# Patient Record
Sex: Female | Born: 1965 | Race: White | Hispanic: No | Marital: Married | Smoking: Never smoker
Health system: Southern US, Community
[De-identification: ages and names within clinical notes are randomized; demographics above are authoritative.]

## PROBLEM LIST (undated history)

## (undated) DIAGNOSIS — I10 Essential (primary) hypertension: Secondary | ICD-10-CM

## (undated) DIAGNOSIS — F0781 Postconcussional syndrome: Secondary | ICD-10-CM

## (undated) HISTORY — DX: Essential (primary) hypertension: I10

## (undated) HISTORY — PX: ADENOIDECTOMY: SUR15

## (undated) HISTORY — DX: Postconcussional syndrome: F07.81

## (undated) HISTORY — PX: TONSILLECTOMY: SUR1361

---

## 2003-12-22 ENCOUNTER — Ambulatory Visit (HOSPITAL_COMMUNITY): Admission: RE | Admit: 2003-12-22 | Discharge: 2003-12-22 | Payer: Self-pay | Admitting: Obstetrics and Gynecology

## 2006-01-31 ENCOUNTER — Ambulatory Visit (HOSPITAL_COMMUNITY): Admission: RE | Admit: 2006-01-31 | Discharge: 2006-01-31 | Payer: Self-pay | Admitting: Obstetrics and Gynecology

## 2006-02-17 IMAGING — RF DG HYSTEROGRAM
3 series · 3 of 3 positions shown · non-contrast
Comparison: none

CLINICAL DATA: Infertility.
HYSTEROGRAM:
Following placement of a 5 French hysterosalpingography catheter into the lower uterine segment by Dr. Novoa, hysterosalpingography was performed.
The end catheter balloon was lowered at the conclusion of the exam to allow evaluation of the lower uterine segment.
A normal endometrial morphology is seen.
Both fallopian tubes have a normal morphology and bilateral free intraperitoneal spill was documented.  No evidence for loculation of contrast was seen in the pelvis on the post-drainage film to suggest the presence of adhesions.  
IMPRESSION
Normal HSG.

[Series 1: run · 1 of 1 slices shown (1 of 3)]
[im 1/1]
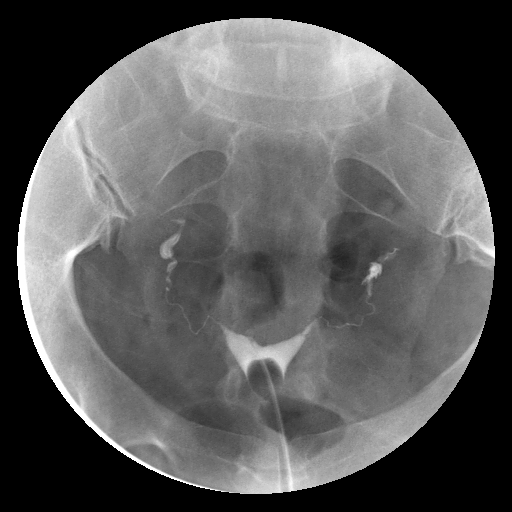

[Series 2: run · 1 of 1 slices shown (2 of 3)]
[im 1/1]
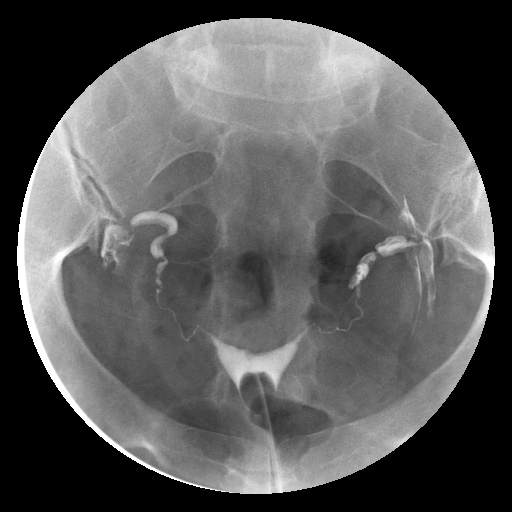

[Series 3: run · 1 of 1 slices shown (3 of 3)]
[im 1/1]
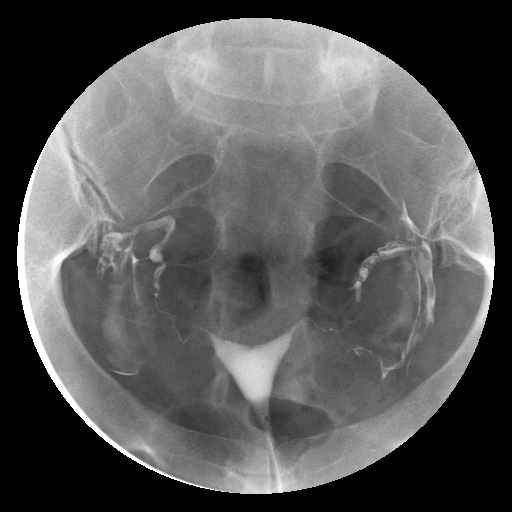

[3 of 3 positions shown; findings below may reference images not displayed]

## 2006-03-30 ENCOUNTER — Inpatient Hospital Stay (HOSPITAL_COMMUNITY): Admission: AD | Admit: 2006-03-30 | Discharge: 2006-03-30 | Payer: Self-pay | Admitting: Obstetrics and Gynecology

## 2006-07-25 ENCOUNTER — Inpatient Hospital Stay: Admission: AD | Admit: 2006-07-25 | Discharge: 2006-07-25 | Payer: Self-pay | Admitting: *Deleted

## 2006-07-26 ENCOUNTER — Inpatient Hospital Stay (HOSPITAL_COMMUNITY): Admission: RE | Admit: 2006-07-26 | Discharge: 2006-07-29 | Payer: Self-pay | Admitting: Obstetrics and Gynecology

## 2006-07-30 ENCOUNTER — Encounter: Admission: RE | Admit: 2006-07-30 | Discharge: 2006-08-28 | Payer: Self-pay | Admitting: Obstetrics and Gynecology

## 2006-08-29 ENCOUNTER — Encounter: Admission: RE | Admit: 2006-08-29 | Discharge: 2006-09-28 | Payer: Self-pay | Admitting: Obstetrics and Gynecology

## 2006-09-29 ENCOUNTER — Encounter: Admission: RE | Admit: 2006-09-29 | Discharge: 2006-10-28 | Payer: Self-pay | Admitting: Obstetrics and Gynecology

## 2006-10-29 ENCOUNTER — Encounter: Admission: RE | Admit: 2006-10-29 | Discharge: 2006-11-28 | Payer: Self-pay | Admitting: Obstetrics and Gynecology

## 2006-11-29 ENCOUNTER — Encounter: Admission: RE | Admit: 2006-11-29 | Discharge: 2006-12-29 | Payer: Self-pay | Admitting: Obstetrics and Gynecology

## 2006-12-30 ENCOUNTER — Encounter: Admission: RE | Admit: 2006-12-30 | Discharge: 2007-01-28 | Payer: Self-pay | Admitting: Obstetrics and Gynecology

## 2007-01-29 ENCOUNTER — Encounter: Admission: RE | Admit: 2007-01-29 | Discharge: 2007-02-28 | Payer: Self-pay | Admitting: Obstetrics and Gynecology

## 2007-03-01 ENCOUNTER — Encounter: Admission: RE | Admit: 2007-03-01 | Discharge: 2007-03-30 | Payer: Self-pay | Admitting: Obstetrics and Gynecology

## 2007-03-31 ENCOUNTER — Encounter: Admission: RE | Admit: 2007-03-31 | Discharge: 2007-04-30 | Payer: Self-pay | Admitting: Obstetrics and Gynecology

## 2007-05-01 ENCOUNTER — Encounter: Admission: RE | Admit: 2007-05-01 | Discharge: 2007-05-13 | Payer: Self-pay | Admitting: Obstetrics and Gynecology

## 2010-08-28 NOTE — Discharge Summary (Signed)
Christina Frank, Christina Frank        ACCOUNT NO.:  [id number]   MEDICAL RECORD NO.:  [id number]          PATIENT TYPE:  INP   LOCATION:  9122                          FACILITY:  [facility]   PHYSICIAN:  Lenoard Aden, M.D.DATE OF BIRTH:  1965/06/12   DATE OF ADMISSION:  07/26/2006  DATE OF DISCHARGE:  07/29/2006                               DISCHARGE SUMMARY   The patient underwent uncomplicated primary C-section for persistent  breech.  Postoperative course uncomplicated.  Discharged to home day #3.  Discharge teaching done.  Tylox, prenatal vitamins and ibuprofen given.  Follow up in the office 4-6 weeks.      Lenoard Aden, M.D.  Electronically Signed     RJT/MEDQ  D:  09/21/2006  T:  09/22/2006  Job:  [id number]

## 2010-08-31 NOTE — Consult Note (Signed)
Christina Frank        ACCOUNT NO.:  [id number]   MEDICAL RECORD NO.:  [id number]          PATIENT TYPE:  MAT   LOCATION:  MATC                          FACILITY:  [facility]   PHYSICIAN:  Lenoard Aden, M.D.DATE OF BIRTH:  20-Aug-1965   DATE OF CONSULTATION:  03/30/2006  DATE OF DISCHARGE:                                 CONSULTATION   CHIEF COMPLAINT:  Lower abdominal cramping and pressure.   HISTORY OF PRESENT ILLNESS:  She is a 45 year old white female G2, P1-0-  1 at [redacted] weeks gestation, who presents with aforementioned  symptomatology. She denies bleeding, cramping, leakage of fluid. She has  a history of a 23 week spontaneous rupture of membranes with delivery  and viable fetus.   MEDICATIONS:  Include 17 hydroxyprogesterone and prenatal vitamins. She  is a non-smoker, non-drinker. She denies domestic or physical violence.   SOCIAL HISTORY:  Noncontributory.   FAMILY HISTORY:  Noncontributory.   PHYSICAL EXAMINATION:  GENERAL:  A well developed, well nourished, white  female in no acute distress.  HEENT:  Normal.  NECK:  Supple.  LUNGS:  Clear.  HEART:  Regular rate and rhythm.  ABDOMEN:  Soft, gravid, non-tender.  BACK:  No CVA tenderness.  SKIN:  Intact.  NEUROLOGIC:  Intact examination.  PELVIC:  Examination reveals cervix to be closed, long, cerclage intact  in presenting part of the pelvis.   LABORATORY DATA:  Wet prep negative. Urinalysis negative. NST is  reassuring for 22 weeks with fetal heart tones in the 150 to 160 beats  per minute range. No contractions are noted.   IMPRESSION:  1. A 22 week OB.  2. Stable cerclage.  3. Negative urine and negative wet prep.   PLAN:  Discharge to home. Check fetal fibronectin. Followup in the  office within 1 week.      Lenoard Aden, M.D.  Electronically Signed     RJT/MEDQ  D:  03/30/2006  T:  03/30/2006  Job:  [id number]

## 2010-08-31 NOTE — H&P (Signed)
Christina Frank, Christina Frank        ACCOUNT NO.:  [id number]   MEDICAL RECORD NO.:  [id number]          PATIENT TYPE:  OIB   LOCATION:  9171                          [facility]ACILITY:  [facility]   PHYSICIAN:  Lenoard Aden, M.D.DATE OF BIRTH:  May 04, 1965   DATE OF ADMISSION:  07/25/2006  DATE OF DISCHARGE:                              HISTORY & PHYSICAL   CHIEF COMPLAINT:  Breech.   She is a 45 year old white female G2 P1 at 38-6/7 weeks for attempted  external cephalic version to known breech presentation.  She is a  nonsmoker, nondrinker.  Denies domestic or physical violence.   MEDICATIONS:  Prenatal vitamins.   She has a history of cervical incompetence, status post cerclage  removal.  She has a history of preterm birth at 58 weeks.  There are no  medical or surgical hospitalizations.   FAMILY HISTORY:  Noncontributory.   PHYSICAL EXAMINATION:  VITAL SIGNS:  Blood pressure 132/76.  HEENT:  Normal.  LUNGS:  Clear.  HEART:  Regular rate and rhythm.  ABDOMEN:  Soft, gravid, and nontender.  Confirmed breech presentation by  ultrasound.  NST is reactive.  An attempted external cephalic version  was done, with forward and backward rolls attempted x2, without success.  Fetal heart tones remained stable throughout the entire procedure.  The  patient tolerates well.  No vaginal bleeding noted.  No leakage of fluid  apparent.  EXTREMITIES:  There are no cords.  NEUROLOGIC:  Nonfocal.  SKIN:  Intact.  PELVIC:  Vaginal exam is deferred.   IMPRESSION:  1. A 38-6/7 week intrauterine pregnancy.  2. Gestational hypertension.  Labs pending.  3. Failed external cephalic version, with persistent breech      presentation.   PLAN:  Check labs.  Pending labs.  Will consider primary C-section  versus discharge home and follow up for a possible spontaneous version  per patient discussion.     Lenoard Aden, M.D.  Electronically Signed    RJT/MEDQ  D:  07/25/2006  T:  07/25/2006  Job:   [id number]

## 2010-08-31 NOTE — Op Note (Addendum)
Christina Frank, Christina Frank        ACCOUNT NO.:  [id number]   MEDICAL RECORD NO.:  [id number]          PATIENT TYPE:  INP   LOCATION:  9122                          [facility]ACILITY:  [facility]   PHYSICIAN:  Lenoard Aden, M.D.DATE OF BIRTH:  December 08, 1965   DATE OF PROCEDURE:  07/26/2006  DATE OF DISCHARGE:                               OPERATIVE REPORT   PREOPERATIVE DIAGNOSES:  1. A 39-week intrauterine pregnancy.  2. Breech presentation with failed external cephalic version.  3. Gestational hypertension.   POSTOPERATIVE DIAGNOSES:  1. A 39-week intrauterine pregnancy.  2. Breech presentation with failed external cephalic version.  3. Gestational hypertension.   PROCEDURE:  Primary low segment transverse cesarean section.   SURGEON:  Lenoard Aden, M.D.   ASSISTANT:  Genia Del, M.D.   ANESTHESIA:  Spinal by Hatchett.   ESTIMATED BLOOD LOSS:  1000 mL.   COMPLICATIONS:  None.   DRAINS:  Foley.   COUNTS:  Correct.   The patient to recovery in good condition.   FINDINGS:  Full-term living female, complete breech position, Apgars 8/9,  pediatrician in attendance.  Placenta delivered anterior location  manually intact.  Normal tubes, normal ovaries.  Two-layer closure of  the uterus.   OPERATIVE NOTE:  After being apprised of risks of anesthesia, infection,  bleeding, injury to abdominal organs and need for repair, delayed versus  immediate complications including bowel and bladder injury, the patient  was brought to the operating room and was administered spinal anesthetic  without complication, prepped and draped in usual sterile fashion.  Foley catheter was placed.  After achieving adequate anesthesia and  dilute Marcaine solution placed, Pfannenstiel skin incision was made  with a scalpel and carried down to fascia which was nicked in the  midline and entered transversely using Mayo scissors.  Rectus muscles  were dissected sharply in midline.  Peritoneum entered  sharply.  Bladder  blade placed.  Visceral peritoneum was scored in smile-like fashion off  the lower uterine segment.  Kerr hysterotomy incision made.  Anterior  placenta noted.  Transverse incision of the uterus was extended in a  cephalocaudad manner without difficulty.  No extensions noted.  Atraumatic delivery using the standard breech maneuvers with flexion of  the fetal vertex from a complete breech position.  Apgars of 8 and 9  ____ QA MARKER: 89 ____ place  anterior location.  Uterus exteriorized, normal uterine cavity noted.  Uterus was closed in 2 running imbricating layers of 0 Monocryl suture.  Good hemostasis noted.  Bladder flap inspected, found to be hemostatic.  Irrigation accomplished.  Fascia then closed after inspection with 0  Monocryl in continuous running fashion.  Subcutaneous tissue  reapproximated using 0 plain in a continuous running fashion.  Skin  closed using staples.  The patient tolerated the procedure well and was  transferred to recovery in good condition.      Lenoard Aden, M.D.  Electronically Signed     RJT/MEDQ  D:  07/26/2006  T:  07/26/2006  Job:  [id number]

## 2013-09-16 ENCOUNTER — Ambulatory Visit (INDEPENDENT_AMBULATORY_CARE_PROVIDER_SITE_OTHER): Payer: BC Managed Care – PPO

## 2013-09-16 VITALS — BP 139/89 | HR 79 | Resp 18

## 2013-09-16 DIAGNOSIS — M722 Plantar fascial fibromatosis: Secondary | ICD-10-CM

## 2013-09-16 DIAGNOSIS — M79609 Pain in unspecified limb: Secondary | ICD-10-CM

## 2013-09-16 DIAGNOSIS — M7730 Calcaneal spur, unspecified foot: Secondary | ICD-10-CM

## 2013-09-16 MED ORDER — MELOXICAM 15 MG PO TABS: 15.0000 mg | ORAL_TABLET | Freq: Every day | ORAL | Status: DC

## 2013-09-16 NOTE — Progress Notes (Signed)
   Subjective:    Patient ID: Christina Frank, female    DOB: 05-29-1965, 48 y.o.   MRN: [id number]  HPI I have heel pain on my left foot and it has been going on for about a year and burns and shooting pains and hurts after I get up and have broken my left ankle twice and numbness and tingling    Review of Systems  All other systems reviewed and are negative.      Objective:   Physical Exam Neurovascular status is intact pedal pulses palpable bilateral epicritic and proprioceptive sensations intact and symmetric bilateral there is no plantar response DTRs not elicited dermatologically skin color pigment normal hair growth absent nails somewhat criptotic orthopedic biomechanical exam there is pain on first up in the morning or getting off a period of rest retaining inferior calcaneal tubercle area extending up to the medial arch and lateral heel as well sometimes some tenderness in the Lisfranc lateral column fourth fifth metatarsal base and cuboid joint areas well. Patient wearing some flimsy flip-flops at this time wear sneakers however in the very inexpensive pair sneakers not sure of the brand or the stylet may be somewhat flexible. Also walks barefoot around the house almost all the time. Patient slightly overweight and was talk about getting into an exercise program but hasn't done so at this point.       Assessment & Plan:  Assessment based on clinical and radiographic findings his plantar fasciitis/heel spur syndrome with some thickening plantar fascial structures well-developed spurring noted this time fascia both feet painful tender symptomatic left foot worse than right fascial strapping applied to both feet prescription for [facility] is given edema there is once daily x30 days with one refill. Patient also will apply ice to the area and recommended crocs for around the house no barefoot no flimsy shoes or flip-flops discontinue barefoot walking at all times suggest a new balance or  Brooks running or athletic shoes in the future. Followup in 2-3 weeks for orthotic discussion and possible reevaluation  Alvan Dame DPM

## 2013-09-16 NOTE — Patient Instructions (Signed)

## 2013-09-30 ENCOUNTER — Ambulatory Visit: Payer: BC Managed Care – PPO

## 2016-01-14 HISTORY — PX: NASAL SEPTUM SURGERY: SHX37

## 2016-02-19 DIAGNOSIS — F0781 Postconcussional syndrome: Secondary | ICD-10-CM

## 2016-02-19 HISTORY — DX: Postconcussional syndrome: F07.81

## 2017-01-10 ENCOUNTER — Other Ambulatory Visit: Payer: Self-pay | Admitting: Obstetrics and Gynecology

## 2017-01-10 DIAGNOSIS — R1900 Intra-abdominal and pelvic swelling, mass and lump, unspecified site: Secondary | ICD-10-CM

## 2017-01-23 ENCOUNTER — Other Ambulatory Visit: Payer: Self-pay

## 2017-02-03 ENCOUNTER — Encounter: Payer: Self-pay | Admitting: Gynecologic Oncology

## 2017-02-03 ENCOUNTER — Ambulatory Visit: Payer: BC Managed Care – PPO

## 2017-02-03 ENCOUNTER — Other Ambulatory Visit: Payer: Self-pay | Admitting: *Deleted

## 2017-02-03 ENCOUNTER — Ambulatory Visit: Payer: BC Managed Care – PPO | Attending: Gynecologic Oncology | Admitting: Gynecologic Oncology

## 2017-02-03 VITALS — BP 116/62 | HR 80 | Temp 97.8°F | Resp 18 | Ht 65.0 in | Wt 194.6 lb

## 2017-02-03 DIAGNOSIS — N921 Excessive and frequent menstruation with irregular cycle: Secondary | ICD-10-CM | POA: Diagnosis not present

## 2017-02-03 DIAGNOSIS — I10 Essential (primary) hypertension: Secondary | ICD-10-CM | POA: Insufficient documentation

## 2017-02-03 DIAGNOSIS — N888 Other specified noninflammatory disorders of cervix uteri: Secondary | ICD-10-CM | POA: Diagnosis not present

## 2017-02-03 NOTE — Patient Instructions (Signed)
Today we performed a biopsy of your cervical mass and a culture from the fluid that came from it. Dr Andrey Farmerossi does not suspect cancer but will confirm with you what the biopsy and culture showed when it comes back.  She is recommending that you follow-up with Dr [name] in approximately 2 weeks for repeat US to re-evaluate the mass.  Call Dr Oliver Humossi's office at [phone].  It is normal to experience light bleeding and discharge after today's procedure. Please call her office if it becomes heavier.

## 2017-02-03 NOTE — Addendum Note (Signed)
Addended by: Warner MccreedyROSS, Ambry Dix D on: 02/03/2017 11:43 AM   Modules accepted: Orders

## 2017-02-03 NOTE — Progress Notes (Signed)
Consult Note: Gyn-Onc  Consult was requested by Dr. Billy Coast for the evaluation of Christina Frank 51 y.o. female  CC:  Chief Complaint  Patient presents with  . cervical mass    Assessment/Plan:  Christina Frank  is a 51 y.o.  year old with a cervical mass - filled with sebaceous/necrotic fluid.  I do not suspect malignancy.  I will follow-up the cultures and biopsy from today's procedure.  If benign and no infection, I recommend repeat office ultrasound in 2 weeks to evaluate for complete drainage. If persistent, it may be more feasible to have the MRI approved in order to better characterize what the source of the fluid might be.  With respect to her menometorrhagia, I do not believe that this cervical lesion is the etiology of this. I believe her bleeding is secondary to annovulatory bleeding and peri-menopausal status.  I recommend sampling the endometrium and considering hormonal modulation for this (eg mirena IUD).  HPI: Christina Frank is a 51 year old G1 who is seen in consultation at the request of Dr Billy Coast for a cervical mass.  The patient reports 2 years of menometorrhagia. She denies menopausal symptoms. In the past few months she noted vague central abdominal discomfort. She denies fevers or chills.  She was seen by Dr Billy Coast in June, and then again in September and at both visits a cervical mass described as "a complex mas with internal calcifications " measuring 4.3x3.6x3.1cm. There was no increased internal blood flow demonstrated.  The left and right ovaries were grossly normal. Uterine dimensions were 11.3x6.2x5.6. Endometrial stripe was 11.45mm.  An MRI was ordered but declined by insurance.  Current Meds:  Outpatient Encounter Prescriptions as of 02/03/2017  Medication Sig  . busPIRone (BUSPAR) 15 MG tablet TAKE ONE TABLET BY MOUTH TWICE DAILY AS NEEDED FOR FOR ANXIETY  . losartan-hydrochlorothiazide (HYZAAR) 100-25 MG tablet Take 1  tablet by mouth daily.  . Melatonin 3 MG TABS Take by mouth.  . [DISCONTINUED] cefdinir (OMNICEF) 300 MG capsule   . [DISCONTINUED] meloxicam (MOBIC) 15 MG tablet Take 1 tablet (15 mg total) by mouth daily.  . [DISCONTINUED] predniSONE (DELTASONE) 10 MG tablet    No facility-administered encounter medications on file as of 02/03/2017.     Allergy: No Known Allergies  Social Hx:   Social History   Social History  . Marital status: Married    Spouse name: N/A  . Number of children: N/A  . Years of education: N/A   Occupational History  . Not on file.   Social History Main Topics  . Smoking status: Never Smoker  . Smokeless tobacco: Never Used  . Alcohol use No  . Drug use: No  . Sexual activity: Not on file   Other Topics Concern  . Not on file   Social History Narrative  . No narrative on file    Past Surgical Hx:  Past Surgical History:  Procedure Laterality Date  . ADENOIDECTOMY    . CESAREAN SECTION     x 1  . NASAL SEPTUM SURGERY  01/2016  . TONSILLECTOMY      Past Medical Hx:  Past Medical History:  Diagnosis Date  . Hypertension   . Post concussion syndrome 02/19/2016    Past Gynecological History:  PCOS, infertility, spontaneous conception x 1. No abn paps. Last normal pap in 2018. No LMP recorded.  Family Hx:  Family History  Problem Relation Age of Onset  . COPD Father   .  Heart attack Father   . Diabetes Father   . Multiple sclerosis Father   . Diabetes Brother   . Breast cancer Maternal Grandmother   . Diabetes Paternal Grandfather   . Breast cancer Other     Review of Systems:  Constitutional  Feels well,    ENT Normal appearing ears and nares bilaterally Skin/Breast  No rash, sores, jaundice, itching, dryness Cardiovascular  No chest pain, shortness of breath, or edema  Pulmonary  No cough or wheeze.  Gastro Intestinal  No nausea, vomitting, or diarrhoea. No bright red blood per rectum, no abdominal pain, change in bowel  movement, or constipation.  Genito Urinary  No frequency, urgency, dysuria, + menometorrhagia. Occasional bright green discharge from cervix. Musculo Skeletal  No myalgia, arthralgia, joint swelling or pain  Neurologic  No weakness, numbness, change in gait,  Psychology  No depression, anxiety, insomnia.   Vitals:  Blood pressure 116/62, pulse 80, temperature 97.8 F (36.6 C), temperature source Oral, resp. rate 18, height 5\' 5"  (1.651 m), weight 194 lb 9.6 oz (88.3 kg), SpO2 100 %.  Physical Exam: WD in NAD Neck  Supple NROM, without any enlargements.  Lymph Node Survey No cervical supraclavicular or inguinal adenopathy Cardiovascular  Pulse normal rate, regularity and rhythm. S1 and S2 normal.  Lungs  Clear to auscultation bilateraly, without wheezes/crackles/rhonchi. Good air movement.  Skin  No rash/lesions/breakdown  Psychiatry  Alert and oriented to person, place, and time  Abdomen  Normoactive bowel sounds, abdomen soft, non-tender and overweight without evidence of hernia.  Back No CVA tenderness Genito Urinary  Vulva/vagina: Normal external female genitalia.   No lesions. No discharge or bleeding.  Bladder/urethra:  No lesions or masses, well supported bladder  Vagina: normal  Cervix: Normal mucosa with exception of posterior lip - bulging posterior cervix with stromal mass, and overlying mucosal prominent vasculature (but benign appearing). Smooth glistening surface. Biopsy performed - see below.  Uterus:  Small, mobile, no parametrial involvement or nodularity.  Adnexa: no masses. Rectal  deferred Extremities  No bilateral cyanosis, clubbing or edema.  PROCEDURE NOTE: Cervical biopsy  Verbal consent obtained. Time out performed.  A 3mm punch biopsy was used on the posterior lip of the cervix. Drainage of 30cc of thick sebaceous/purulent? Fluid occurred spontaneously. The wall of the cystic lesion was biopsied with a kevorkian forcep. Hemostasis was  achieved with monsels.  Specimens: culture of cervical fluid to microbiology. Cervical biopsy to pathology.  After decompression the mass was no longer palpable, and the cervix was soft with redundant posterior cervix.   Quinn Axeossi, Christalyn Goertz Caroline, MD  02/03/2017, 9:58 AM

## 2017-02-05 LAB — WOUND CULTURE

## 2017-02-06 ENCOUNTER — Telehealth: Payer: Self-pay | Admitting: *Deleted

## 2017-02-06 NOTE — Telephone Encounter (Signed)
Returned Tracy's call from [facility] office at [phone] ext [phone]. Left a message that "[name] did recommend a follow up US in two weeks if the culture and biopsy taken in the office were benign. We can call you with the results once we have them."

## 2017-02-12 ENCOUNTER — Telehealth: Payer: Self-pay

## 2017-02-12 NOTE — Telephone Encounter (Signed)
Told Ms Christina Frank that the biopsy showed  no cancer and the culture was negative for infection per [name].  [name] recommends a follow up US with wiat Dr. [name]'s office in 2 weeks. Pt verbalized understanding.  Spoke with [name] at Dr. [name]'s office to give above path results.  Pt is scheduled for 02-17-17 at 4 pm for US . Faxed path results to Dr. [name].  [name] [facility]
